# Patient Record
Sex: Male | Born: 1960 | Race: White | Hispanic: No | Marital: Married | Smoking: Former smoker
Health system: Southern US, Community
[De-identification: ages and names within clinical notes are randomized; demographics above are authoritative.]

## PROBLEM LIST (undated history)

## (undated) DIAGNOSIS — E119 Type 2 diabetes mellitus without complications: Secondary | ICD-10-CM

## (undated) HISTORY — PX: FRACTURE SURGERY: SHX138

---

## 2014-02-11 ENCOUNTER — Ambulatory Visit: Payer: Self-pay

## 2014-02-16 ENCOUNTER — Ambulatory Visit: Payer: Self-pay | Admitting: Emergency Medicine

## 2020-08-09 ENCOUNTER — Other Ambulatory Visit
Admission: RE | Admit: 2020-08-09 | Discharge: 2020-08-09 | Disposition: A | Discharge status: Home / Self Care | Payer: BC Managed Care – PPO | Source: Ambulatory Visit | Attending: Internal Medicine | Admitting: Internal Medicine

## 2020-08-09 ENCOUNTER — Other Ambulatory Visit: Payer: Self-pay

## 2020-08-09 DIAGNOSIS — Z20822 Contact with and (suspected) exposure to covid-19: Secondary | ICD-10-CM | POA: Insufficient documentation

## 2020-08-09 DIAGNOSIS — Z01818 Encounter for other preprocedural examination: Secondary | ICD-10-CM | POA: Insufficient documentation

## 2020-08-10 ENCOUNTER — Other Ambulatory Visit: Payer: Self-pay

## 2020-08-10 LAB — SARS CORONAVIRUS 2 (TAT 6-24 HRS): SARS Coronavirus 2: NEGATIVE

## 2021-02-06 ENCOUNTER — Other Ambulatory Visit (HOSPITAL_COMMUNITY): Payer: Self-pay | Admitting: Neurology

## 2021-02-06 ENCOUNTER — Other Ambulatory Visit: Payer: Self-pay | Admitting: Neurology

## 2021-02-06 DIAGNOSIS — G250 Essential tremor: Secondary | ICD-10-CM

## 2021-02-16 ENCOUNTER — Other Ambulatory Visit: Payer: Self-pay

## 2021-02-16 ENCOUNTER — Ambulatory Visit
Admission: RE | Admit: 2021-02-16 | Discharge: 2021-02-16 | Disposition: A | Discharge status: Home / Self Care | Payer: BC Managed Care – PPO | Source: Ambulatory Visit | Attending: Neurology | Admitting: Neurology

## 2021-02-16 DIAGNOSIS — G250 Essential tremor: Secondary | ICD-10-CM | POA: Diagnosis not present

## 2022-01-02 IMAGING — MR MR HEAD W/O CM
12 series · 48 of 48 positions shown · non-contrast
Comparison: No pertinent prior exams available for comparison.

CLINICAL DATA: Tremor, essential.

EXAM:
MRI HEAD WITHOUT CONTRAST
TECHNIQUE: Multiplanar, multiecho pulse sequences of the brain and surrounding
structures were obtained without intravenous contrast.

[Series 5: ax dwi_tracew · axial · 3.0mm · 0.65mm/px · z∈[-71,+83]mm · 4 of 48 slices shown]
[im 1/48]
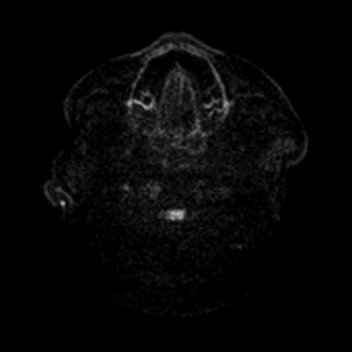
[im 16/48]
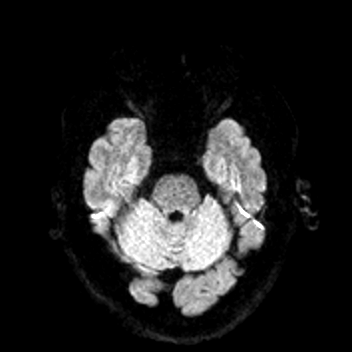
[im 32/48]
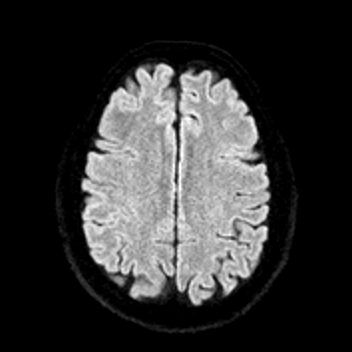
[im 48/48]
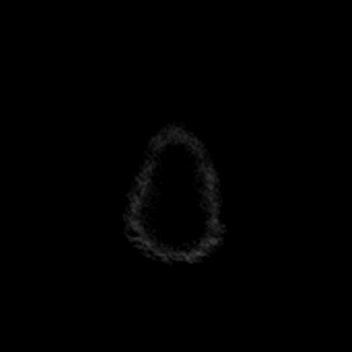

[Series 6: ax dwi_adc · axial · 3.0mm · 0.65mm/px · z∈[-71,+83]mm · 4 of 48 slices shown]
[im 1/48]
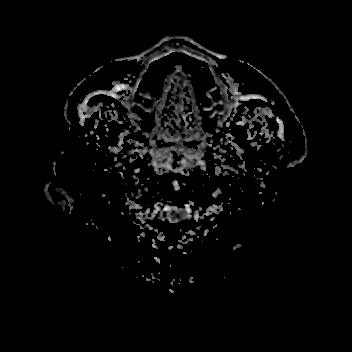
[im 16/48]
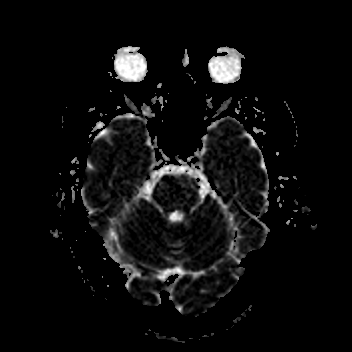
[im 32/48]
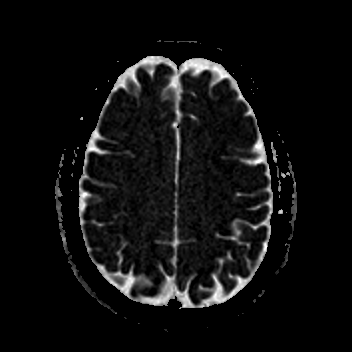
[im 48/48]
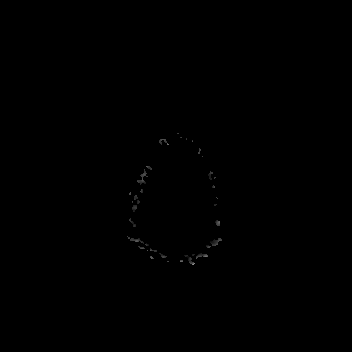

[Series 7: cor dwi_tracew · coronal · 5.0mm · 0.65mm/px · 3 of 38 slices shown]
[im 1/38]
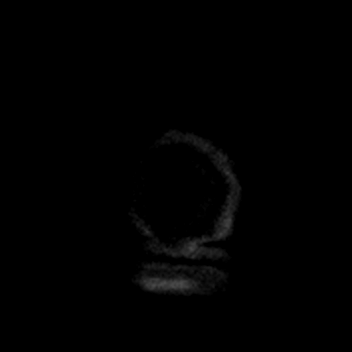
[im 19/38]
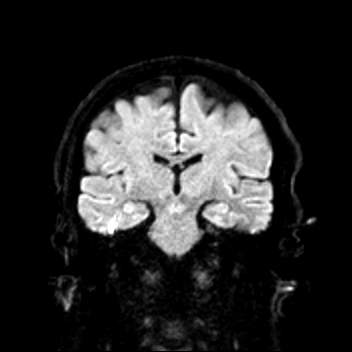
[im 38/38]
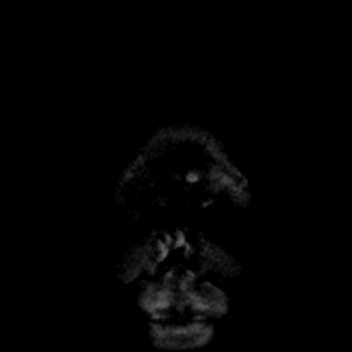

[Series 8: cor dwi_adc · coronal · 5.0mm · 0.65mm/px · 3 of 38 slices shown]
[im 1/38]
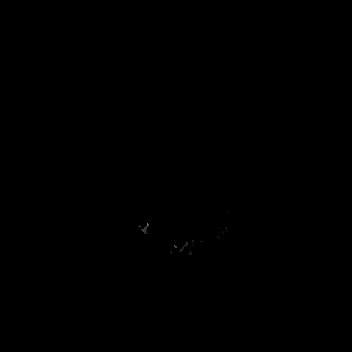
[im 19/38]
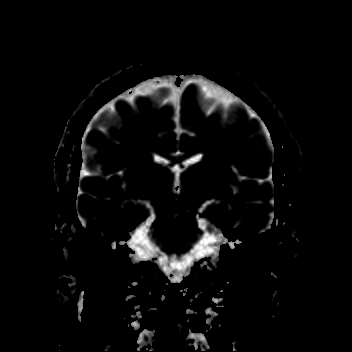
[im 38/38]
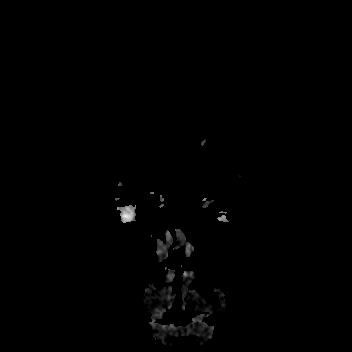

[Series 9: T1 · sagittal · 5.0mm · 0.62mm/px · 2 of 22 slices shown (1 of 2)]
[im 1/22]
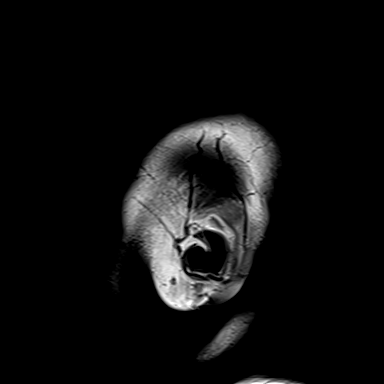
[im 22/22]
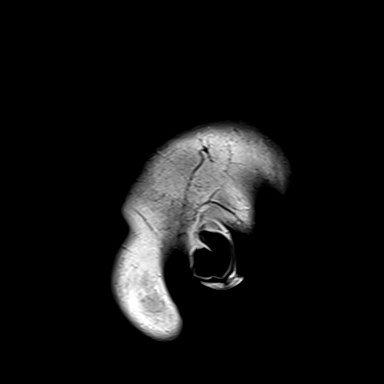

[Series 10: T2 · axial · 5.0mm · 0.53mm/px · z∈[-66,+77]mm · 2 of 25 slices shown (1 of 2)]
[im 1/25]
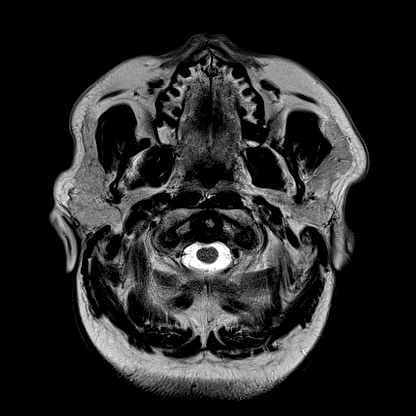
[im 25/25]
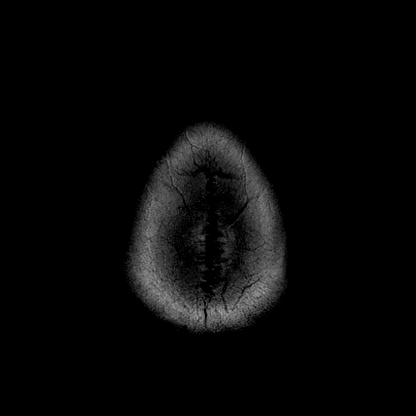

[Series 11: mag_images · axial · 3.0mm · 0.90mm/px · z∈[-81,+94]mm · 4 of 60 slices shown]
[im 1/60]
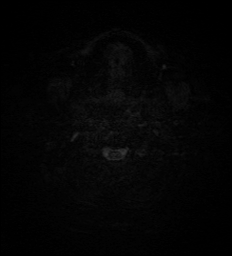
[im 20/60]
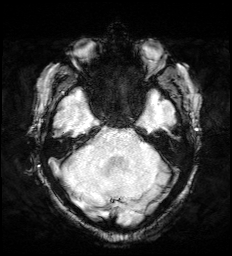
[im 40/60]
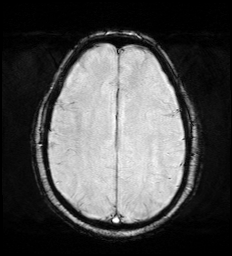
[im 60/60]
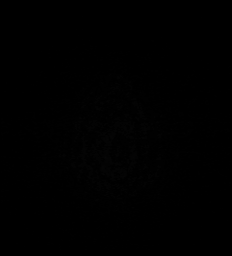

[Series 12: pha_images · axial · 3.0mm · 0.90mm/px · z∈[-81,+94]mm · 4 of 60 slices shown]
[im 1/60]
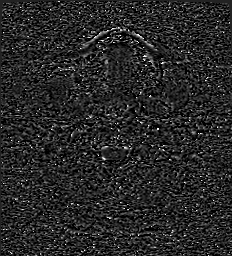
[im 20/60]
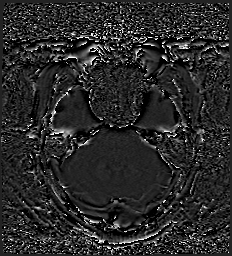
[im 40/60]
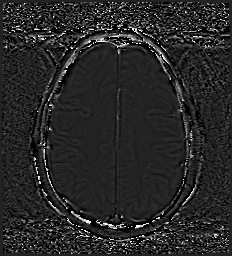
[im 60/60]
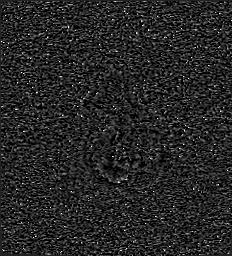

[Series 13: swi_images · axial · 3.0mm · 0.90mm/px · z∈[-81,+94]mm · 4 of 60 slices shown]
[im 1/60]
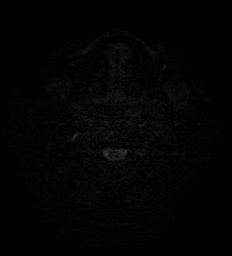
[im 20/60]
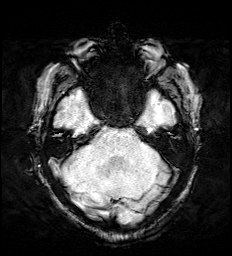
[im 40/60]
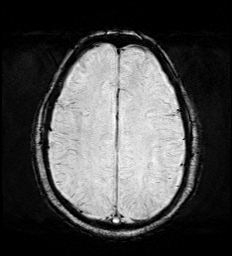
[im 60/60]
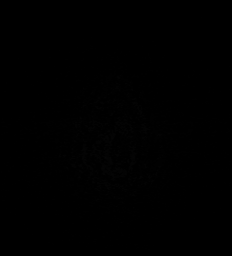

[Series 15: FLAIR · axial · 3.0mm · 0.53mm/px · z∈[-75,+86]mm · 4 of 55 slices shown]
[im 1/55]
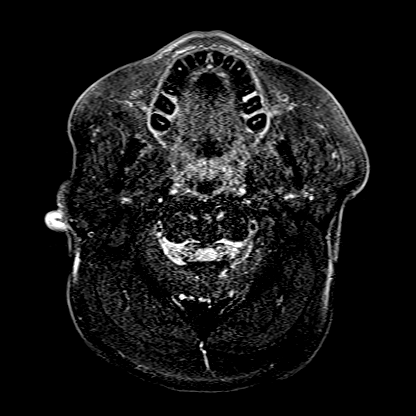
[im 19/55]
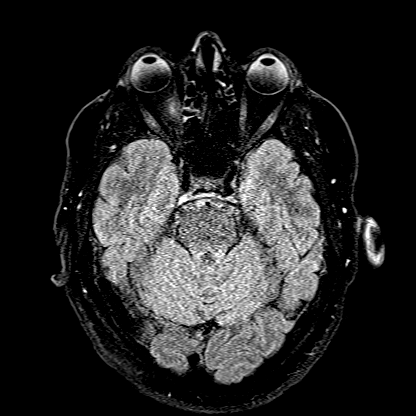
[im 37/55]
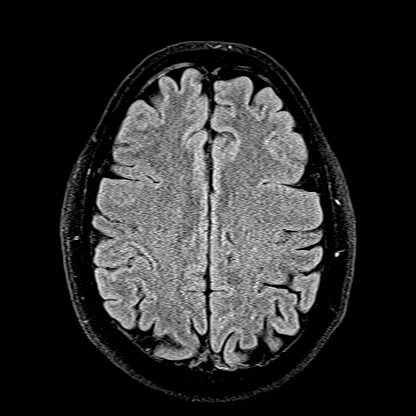
[im 55/55]
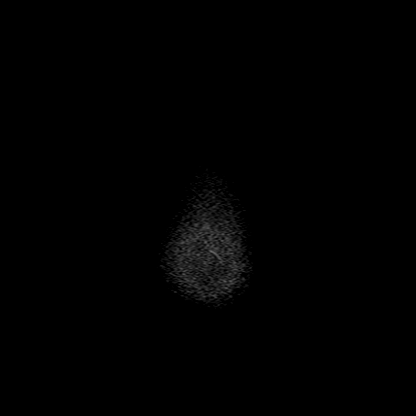

[Series 16: T1 · axial · 1.0mm · 0.98mm/px · z∈[-72,+86]mm · 12 of 160 slices shown (2 of 2)]
[im 1/160]
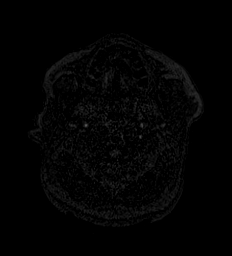
[im 15/160]
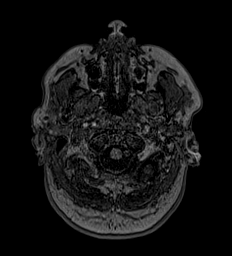
[im 29/160]
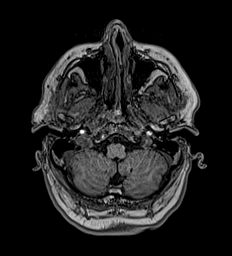
[im 44/160]
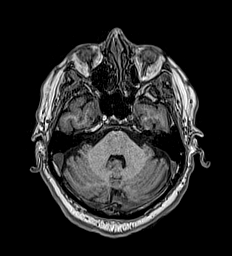
[im 58/160]
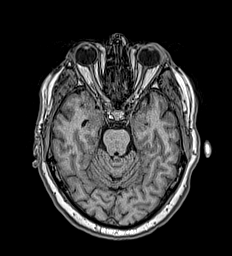
[im 73/160]
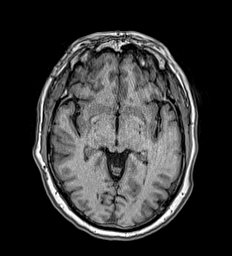
[im 87/160]
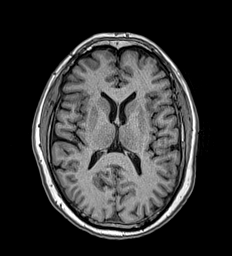
[im 102/160]
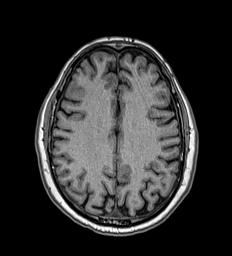
[im 116/160]
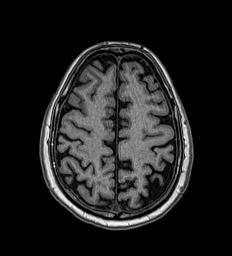
[im 131/160]
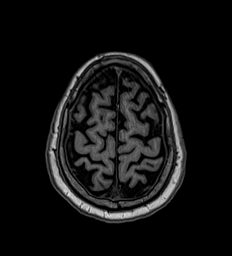
[im 145/160]
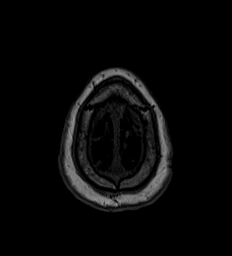
[im 160/160]
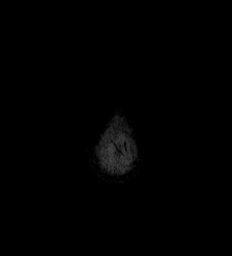

[Series 17: T2 · coronal · 5.0mm · 0.57mm/px · 2 of 29 slices shown (2 of 2)]
[im 1/29]
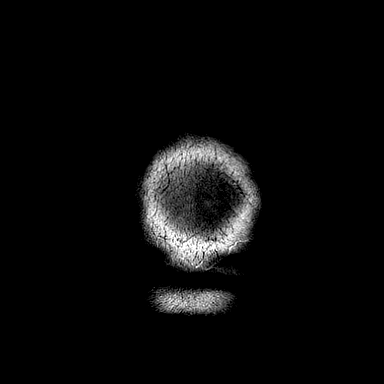
[im 29/29]
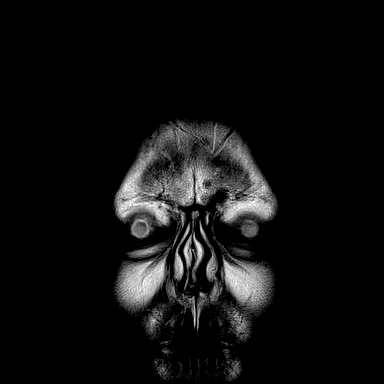

[48 of 48 positions shown; findings below may reference images not displayed]

FINDINGS: Brain:

Mild cerebral and cerebellar atrophy.

No cortical encephalomalacia is identified. No significant white
matter disease.

There is no acute infarct.

No evidence of intracranial mass.

No chronic intracranial blood products.

No extra-axial fluid collection.

No midline shift.

Vascular: Expected proximal arterial flow voids.

Skull and upper cervical spine: No focal marrow lesion.

Sinuses/Orbits: Visualized orbits show no acute finding. No
significant paranasal sinus disease
IMPRESSION: No evidence of acute intracranial abnormality.

Mild generalized parenchymal atrophy.

Otherwise unremarkable noncontrast MRI appearance of the brain.

## 2022-10-26 ENCOUNTER — Ambulatory Visit (INDEPENDENT_AMBULATORY_CARE_PROVIDER_SITE_OTHER): Payer: BC Managed Care – PPO

## 2022-10-26 ENCOUNTER — Ambulatory Visit
Admission: RE | Admit: 2022-10-26 | Discharge: 2022-10-26 | Disposition: A | Discharge status: Home / Self Care | Payer: BC Managed Care – PPO | Source: Ambulatory Visit | Attending: Family Medicine | Admitting: Family Medicine

## 2022-10-26 VITALS — BP 170/65 | HR 79 | Temp 97.8°F | Resp 16 | Ht 70.0 in | Wt 240.0 lb

## 2022-10-26 DIAGNOSIS — J40 Bronchitis, not specified as acute or chronic: Secondary | ICD-10-CM

## 2022-10-26 DIAGNOSIS — R059 Cough, unspecified: Secondary | ICD-10-CM | POA: Diagnosis not present

## 2022-10-26 DIAGNOSIS — I10 Essential (primary) hypertension: Secondary | ICD-10-CM

## 2022-10-26 DIAGNOSIS — Z87891 Personal history of nicotine dependence: Secondary | ICD-10-CM

## 2022-10-26 HISTORY — DX: Type 2 diabetes mellitus without complications: E11.9

## 2022-10-26 MED ORDER — CEFDINIR 300 MG PO CAPS: 300.0000 mg | ORAL_CAPSULE | Freq: Two times a day (BID) | ORAL | 0 refills | Status: AC

## 2022-10-26 MED ORDER — PREDNISONE 50 MG PO TABS: 50.0000 mg | ORAL_TABLET | Freq: Every day | ORAL | 0 refills | Status: AC

## 2022-10-26 MED ORDER — PROMETHAZINE-DM 6.25-15 MG/5ML PO SYRP: 5.0000 mL | ORAL_SOLUTION | Freq: Four times a day (QID) | ORAL | 0 refills | Status: DC | PRN

## 2022-10-26 NOTE — ED Triage Notes (Signed)
Patient c/o cough and chest congestion for 6 weeks.  Patient has been on no medication or antibiotics.  Patient denies fevers.

## 2022-10-26 NOTE — ED Provider Notes (Signed)
[facility]    CSN: [id number] Arrival date & time: 10/26/22  1641      History   Chief Complaint Chief Complaint  Patient presents with   Cough    Appointment    HPI Tommy Bowen is a 61 y.o. male.   HPI   Tommy Bowen presents for cough and congestion for the past 6 weeks. Taking NyQuil to help him sleep.  He has woken up several times due to his cough.  Notes that he used to smoke.  Denies any recent fevers, chills, vomiting, leg swelling, diarrhea, headache or abdominal pain.  He has high blood pressure and reports his BP was good the day he went to get it checked a few week ago.        Past Medical History:  Diagnosis Date   Diabetes mellitus without complication (HCC)     There are no problems to display for this patient.   Past Surgical History:  Procedure Laterality Date   FRACTURE SURGERY         Home Medications    Prior to Admission medications   Medication Sig Start Date End Date Taking? Authorizing Provider  cefdinir (OMNICEF) 300 MG capsule Take 1 capsule (300 mg total) by mouth 2 (two) times daily for 5 days. 10/26/22 10/31/22 Yes Hidaya Daniel, DO  empagliflozin (JARDIANCE) 25 MG TABS tablet Take 1 tablet by mouth daily. 08/20/22 08/20/23 Yes [provider]  glipiZIDE (GLUCOTROL) 5 MG tablet Take by mouth. 06/13/22  Yes [provider]  JANUVIA 100 MG tablet Take 1 tablet by mouth daily. 06/26/22  Yes [provider]  lisinopril (ZESTRIL) 20 MG tablet Take 20 mg by mouth daily.   Yes [provider]  metFORMIN (GLUCOPHAGE) 850 MG tablet Take 850 mg by mouth 3 (three) times daily.   Yes [provider]  pioglitazone (ACTOS) 45 MG tablet Take 1 tablet by mouth daily. 09/24/22  Yes [provider]  predniSONE (DELTASONE) 50 MG tablet Take 1 tablet (50 mg total) by mouth daily for 5 days. 10/26/22 10/31/22 Yes Vedh Ptacek, DO  promethazine-dextromethorphan (PROMETHAZINE-DM) 6.25-15  MG/5ML syrup Take 5 mLs by mouth 4 (four) times daily as needed. 10/26/22  Yes Anesia Blackwell, DO  propranolol ER (INDERAL LA) 80 MG 24 hr capsule Take by mouth. 05/31/22 05/31/23 Yes [provider]  atorvastatin (LIPITOR) 10 MG tablet Take 10 mg by mouth daily.    [provider]  Cholecalciferol (VITAMIN D-1000 MAX ST) 25 MCG (1000 UT) tablet Take by mouth.    [provider]    Family History History reviewed. No pertinent family history.  Social History Social History   Tobacco Use   Smoking status: Former    Types: Cigarettes   Smokeless tobacco: Former  Building services engineer Use: Never used  Substance Use Topics   Alcohol use: Yes   Drug use: Never     Allergies   Patient has no known allergies.   Review of Systems Review of Systems: negative unless otherwise stated in HPI.      Physical Exam Triage Vital Signs ED Triage Vitals  Enc Vitals Group     BP 10/26/22 1704 (!) 170/65     Pulse Rate 10/26/22 1704 79     Resp 10/26/22 1704 16     Temp 10/26/22 1704 97.8 F (36.6 C)     Temp Source 10/26/22 1704 Oral     SpO2 10/26/22 1704 97 %  Weight 10/26/22 1700 240 lb (108.9 kg)     Height 10/26/22 1700 5\' 10"  (1.778 m)     Head Circumference --      Peak Flow --      Pain Score 10/26/22 1700 0     Pain Loc --      Pain Edu? --      Excl. in GC? --    No data found.  Updated Vital Signs BP (!) 170/65 (BP Location: Right Arm)   Pulse 79   Temp 97.8 F (36.6 C) (Oral)   Resp 16   Ht 5\' 10"  (1.778 m)   Wt 108.9 kg   SpO2 97%   BMI 34.44 kg/m   Visual Acuity Right Eye Distance:   Left Eye Distance:   Bilateral Distance:    Right Eye Near:   Left Eye Near:    Bilateral Near:     Physical Exam GEN:     alert, non-toxic appearing male in no distress    HENT:  mucus membranes moist, no nasal discharge EYES:   pupils equal and reactive, no scleral injection or discharge NECK:  normal ROM RESP:  no increased work of  breathing, diffuse rhonchi and bronchospastic cough with deep breathing CVS:   regular rate and rhythm, no JVP, no appreciable murmur Skin:   warm and dry    UC Treatments / Results  Labs (all labs ordered are listed, but only abnormal results are displayed) Labs Reviewed - No data to display  EKG   Radiology No results found.  Procedures Procedures (including critical care time)  Medications Ordered in UC Medications - No data to display  Initial Impression / Assessment and Plan / UC Course  I have reviewed the triage vital signs and the nursing notes.  Pertinent labs & imaging results that were available during my care of the patient were reviewed by me and considered in my medical decision making (see chart for details).       Pt is a 61 y.o. male former smoker who presents for 6 weeks of cough that is not improving.  Tommy Bowen is  afebrile here without recent antipyretics. Satting well, on room air. Overall, pt is  non-toxic appearing, well hydrated, without respiratory distress. Pulmonary exam is remarkable for rhonchi that clear with cough.  I do not see any chest x-rays available and he is a former smoker.  He may have COPD.  After shared decision making, we will pursue chest x-ray.  COVID  and influenza testing deferred due to length of symptoms.   Treat acute bronchitis with steroids and antibiotics as below.  Promethazine DM cough syrup given for cough and allow patient to rest.  Typical duration of symptoms discussed.   Tommy Bowen is hypertensive today, BP 170/65.  On chart review at his last office visit on 10/01/2022 by his PCP his blood pressure was stable, BP 130/60.  He takes lisinopril 20 mg daily.  Advised to follow-up with his primary care provider about his blood pressures if they remain elevated.  He voiced understanding.  Return and ED precautions given. Discussed MDM, treatment plan and plan for follow-up with patient who agrees with plan.      Final  Clinical Impressions(s) / UC Diagnoses   Final diagnoses:  Bronchitis  Elevated blood pressure reading with diagnosis of hypertension  History of tobacco use     Discharge Instructions      Stop by the pharmacy to pick up your prescriptions.  Follow  up with your primary care provider, if cough is not improving.   Go to ED for red flag symptoms, including; fevers you cannot reduce with Tylenol/Motrin, severe headaches, vision changes, numbness/weakness in part of the body, lethargy, confusion, intractable vomiting, severe dehydration, chest pain, breathing difficulty, severe persistent abdominal or pelvic pain, signs of severe infection (increased redness, swelling of an area), feeling faint or passing out, dizziness, etc. You should especially go to the ED for sudden acute worsening of condition if you do not elect to go at this time.       ED Prescriptions     Medication Sig Dispense Auth. Provider   predniSONE (DELTASONE) 50 MG tablet Take 1 tablet (50 mg total) by mouth daily for 5 days. 5 tablet Lacara Dunsworth, DO   cefdinir (OMNICEF) 300 MG capsule Take 1 capsule (300 mg total) by mouth 2 (two) times daily for 5 days. 10 capsule Karrine Kluttz, DO   promethazine-dextromethorphan (PROMETHAZINE-DM) 6.25-15 MG/5ML syrup Take 5 mLs by mouth 4 (four) times daily as needed. 118 mL Michel Eskelson, Seward Meth, DO      I have reviewed the PDMP during this encounter.   Katha Cabal, DO 10/29/22 1153

## 2022-10-26 NOTE — Discharge Instructions (Addendum)
Stop by the pharmacy to pick up your prescriptions.  Follow up with your primary care provider, if cough is not improving.   Go to ED for red flag symptoms, including; fevers you cannot reduce with Tylenol/Motrin, severe headaches, vision changes, numbness/weakness in part of the body, lethargy, confusion, intractable vomiting, severe dehydration, chest pain, breathing difficulty, severe persistent abdominal or pelvic pain, signs of severe infection (increased redness, swelling of an area), feeling faint or passing out, dizziness, etc. You should especially go to the ED for sudden acute worsening of condition if you do not elect to go at this time.

## 2024-01-14 ENCOUNTER — Ambulatory Visit
Admission: RE | Admit: 2024-01-14 | Discharge: 2024-01-14 | Disposition: A | Discharge status: Home / Self Care | Payer: Self-pay | Source: Ambulatory Visit

## 2024-01-14 VITALS — BP 135/73 | HR 63 | Temp 98.3°F | Resp 16

## 2024-01-14 DIAGNOSIS — J069 Acute upper respiratory infection, unspecified: Secondary | ICD-10-CM | POA: Diagnosis not present

## 2024-01-14 MED ORDER — AMOXICILLIN-POT CLAVULANATE 875-125 MG PO TABS: 1.0000 | ORAL_TABLET | Freq: Two times a day (BID) | ORAL | 0 refills | Status: AC

## 2024-01-14 MED ORDER — IPRATROPIUM BROMIDE 0.06 % NA SOLN: 2.0000 | Freq: Four times a day (QID) | NASAL | 12 refills | Status: AC

## 2024-01-14 MED ORDER — PROMETHAZINE-DM 6.25-15 MG/5ML PO SYRP: 5.0000 mL | ORAL_SOLUTION | Freq: Four times a day (QID) | ORAL | 0 refills | Status: DC | PRN

## 2024-01-14 MED ORDER — BENZONATATE 100 MG PO CAPS: 200.0000 mg | ORAL_CAPSULE | Freq: Three times a day (TID) | ORAL | 0 refills | Status: AC

## 2024-01-14 NOTE — ED Provider Notes (Signed)
 [facility]    CSN: [id number] Arrival date & time: 01/14/24  1343      History   Chief Complaint Chief Complaint  Patient presents with   Cough    Wife (Piper Nong) was seen for same symptoms 01/08/2024. - Entered by patient    HPI Tommy Bowen is a 63 y.o. male.   HPI  63 year old male past medical history significant for diabetes presents for evaluation of respiratory symptoms that began 1 week ago that consist of runny nose, nasal congestion, postnasal drip, and a nonproductive cough.  He denies fever, sore throat, ear pain, shortness breath, or wheezing.  His wife has similar symptoms and was diagnosed with an upper respiratory tract infection.  Past Medical History:  Diagnosis Date   Diabetes mellitus without complication (HCC)     There are no active problems to display for this patient.   Past Surgical History:  Procedure Laterality Date   FRACTURE SURGERY         Home Medications    Prior to Admission medications   Medication Sig Start Date End Date Taking? Authorizing Provider  amoxicillin-clavulanate (AUGMENTIN) 875-125 MG tablet Take 1 tablet by mouth every 12 (twelve) hours for 7 days. 01/14/24 01/21/24 Yes Becky Augusta, NP  atorvastatin (LIPITOR) 10 MG tablet Take 10 mg by mouth daily.   Yes [provider]  benzonatate (TESSALON) 100 MG capsule Take 2 capsules (200 mg total) by mouth every 8 (eight) hours. 01/14/24  Yes Becky Augusta, NP  empagliflozin (JARDIANCE) 25 MG TABS tablet Take 1 tablet by mouth daily. 07/25/23  Yes [provider]  glipiZIDE (GLUCOTROL) 5 MG tablet Take by mouth. 06/13/22  Yes [provider]  ipratropium (ATROVENT) 0.06 % nasal spray Place 2 sprays into both nostrils 4 (four) times daily. 01/14/24  Yes Becky Augusta, NP  JANUVIA 100 MG tablet Take 1 tablet by mouth daily. 06/26/22  Yes [provider]  lisinopril (ZESTRIL) 20 MG tablet Take 20 mg by mouth daily.   Yes [provider]  metFORMIN (GLUCOPHAGE) 850 MG tablet Take 850 mg by mouth 3 (three) times daily.   Yes [provider]  promethazine-dextromethorphan (PROMETHAZINE-DM) 6.25-15 MG/5ML syrup Take 5 mLs by mouth 4 (four) times daily as needed. 01/14/24  Yes Becky Augusta, NP  Cholecalciferol (VITAMIN D-1000 MAX ST) 25 MCG (1000 UT) tablet Take by mouth.    [provider]  pioglitazone (ACTOS) 45 MG tablet Take 1 tablet by mouth daily. 09/24/22  Yes [provider]  propranolol ER (INDERAL LA) 80 MG 24 hr capsule Take by mouth. 05/31/22 01/14/24 Yes [provider]    Family History History reviewed. No pertinent family history.  Social History Social History   Tobacco Use   Smoking status: Former    Types: Cigarettes   Smokeless tobacco: Former  Building services engineer status: Never Used  Substance Use Topics   Alcohol use: Yes   Drug use: Never     Allergies   Patient has no known allergies.   Review of Systems Review of Systems  Constitutional:  Negative for fever.  HENT:  Positive for congestion and rhinorrhea. Negative for sore throat.   Respiratory:  Positive for cough. Negative for shortness of breath and wheezing.      Physical Exam Triage Vital Signs ED Triage Vitals [01/14/24 1408]  Encounter Vitals Group     BP      Systolic BP Percentile  Diastolic BP Percentile      Pulse      Resp      Temp      Temp src      SpO2      Weight      Height      Head Circumference      Peak Flow      Pain Score 0     Pain Loc      Pain Education      Exclude from Growth Chart    No data found.  Updated Vital Signs BP 135/73 (BP Location: Left Arm)   Pulse 63   Temp 98.3 F (36.8 C) (Oral)   Resp 16   SpO2 94%   Visual Acuity Right Eye Distance:   Left Eye Distance:   Bilateral Distance:    Right Eye Near:   Left Eye Near:    Bilateral Near:     Physical Exam Vitals and nursing note reviewed.  Constitutional:       Appearance: Normal appearance. He is not ill-appearing.  HENT:     Head: Normocephalic and atraumatic.     Right Ear: Tympanic membrane, ear canal and external ear normal. There is no impacted cerumen.     Left Ear: Tympanic membrane, ear canal and external ear normal. There is no impacted cerumen.     Nose: Congestion and rhinorrhea present.     Comments: Nasal mucosa is edematous erythematous with yellow discharge in both nares.    Mouth/Throat:     Mouth: Mucous membranes are moist.     Pharynx: Oropharynx is clear. No oropharyngeal exudate or posterior oropharyngeal erythema.  Cardiovascular:     Rate and Rhythm: Normal rate and regular rhythm.     Pulses: Normal pulses.     Heart sounds: Normal heart sounds. No murmur heard.    No friction rub. No gallop.  Pulmonary:     Effort: Pulmonary effort is normal.     Breath sounds: Normal breath sounds. No wheezing, rhonchi or rales.  Musculoskeletal:     Cervical back: Normal range of motion and neck supple. No tenderness.  Lymphadenopathy:     Cervical: No cervical adenopathy.  Skin:    General: Skin is warm and dry.     Capillary Refill: Capillary refill takes less than 2 seconds.     Findings: No rash.  Neurological:     General: No focal deficit present.     Mental Status: He is alert and oriented to person, place, and time.      UC Treatments / Results  Labs (all labs ordered are listed, but only abnormal results are displayed) Labs Reviewed - No data to display  EKG   Radiology No results found.  Procedures Procedures (including critical care time)  Medications Ordered in UC Medications - No data to display  Initial Impression / Assessment and Plan / UC Course  I have reviewed the triage vital signs and the nursing notes.  Pertinent labs & imaging results that were available during my care of the patient were reviewed by me and considered in my medical decision making (see chart for details).   Patient is a  pleasant, nontoxic-appearing 63 year old male presenting for evaluation of 1 week with the respiratory symptoms outlined HPI above.  His physical exam does reveal inflammation of his upper respiratory tract as evidenced by inflamed nasal mucosa with yellow discharge in both nares.  Oropharyngeal exam is benign.  No cervical adenopathy present  exam.  Cardiopulmonary exam physical lung sounds in all fields.  Patient exam is consistent with an upper respiratory infection.  Given that he has had symptoms for a week I do feel that a trial of antibiotics is warranted.  I will start him on Augmentin 875 mg twice daily for 7 days for treatment of URI.  Atrovent nasal spray for his nasal congestion.  Tessalon Perles and Promethazine DM cough syrup for cough and congestion.  Return precautions reviewed.   Final Clinical Impressions(s) / UC Diagnoses   Final diagnoses:  URI with cough and congestion     Discharge Instructions      Take the Augmentin twice daily with food for 7 days for treatment of your upper respiratory infection.  Use over-the-counter Tylenol and/or ibuprofen according the pack instructions as needed for any fever or pain.  Use the Atrovent nasal spray, 2 squirts in each nostril every 6 hours, as needed for runny nose and postnasal drip.  Use the Tessalon Perles every 8 hours during the day.  Take them with a small sip of water.  They may give you some numbness to the base of your tongue or a metallic taste in your mouth, this is normal.  Use the Promethazine DM cough syrup at bedtime for cough and congestion.  It will make you drowsy so do not take it during the day.  Return for reevaluation or see your primary care provider for any new or worsening symptoms.      ED Prescriptions     Medication Sig Dispense Auth. Provider   amoxicillin-clavulanate (AUGMENTIN) 875-125 MG tablet Take 1 tablet by mouth every 12 (twelve) hours for 7 days. 14 tablet Becky Augusta, NP   benzonatate  (TESSALON) 100 MG capsule Take 2 capsules (200 mg total) by mouth every 8 (eight) hours. 21 capsule Becky Augusta, NP   ipratropium (ATROVENT) 0.06 % nasal spray Place 2 sprays into both nostrils 4 (four) times daily. 15 mL Becky Augusta, NP   promethazine-dextromethorphan (PROMETHAZINE-DM) 6.25-15 MG/5ML syrup Take 5 mLs by mouth 4 (four) times daily as needed. 118 mL Becky Augusta, NP      PDMP not reviewed this encounter.   Becky Augusta, NP 01/14/24 1424

## 2024-01-14 NOTE — Discharge Instructions (Addendum)
 Take the Augmentin twice daily with food for 7 days for treatment of your upper respiratory infection.  Use over-the-counter Tylenol and/or ibuprofen according the pack instructions as needed for any fever or pain.  Use the Atrovent nasal spray, 2 squirts in each nostril every 6 hours, as needed for runny nose and postnasal drip.  Use the Tessalon Perles every 8 hours during the day.  Take them with a small sip of water.  They may give you some numbness to the base of your tongue or a metallic taste in your mouth, this is normal.  Use the Promethazine DM cough syrup at bedtime for cough and congestion.  It will make you drowsy so do not take it during the day.  Return for reevaluation or see your primary care provider for any new or worsening symptoms.

## 2024-01-14 NOTE — ED Triage Notes (Signed)
 Sx x 1 week   Non productive cough

## 2024-11-26 ENCOUNTER — Ambulatory Visit
Admission: RE | Admit: 2024-11-26 | Discharge: 2024-11-26 | Disposition: A | Discharge status: Home / Self Care | Payer: Self-pay | Source: Ambulatory Visit | Attending: Family Medicine | Admitting: Family Medicine

## 2024-11-26 VITALS — BP 162/74 | HR 67 | Temp 97.8°F | Resp 18

## 2024-11-26 DIAGNOSIS — J40 Bronchitis, not specified as acute or chronic: Secondary | ICD-10-CM | POA: Diagnosis not present

## 2024-11-26 DIAGNOSIS — Z87891 Personal history of nicotine dependence: Secondary | ICD-10-CM

## 2024-11-26 MED ORDER — PROMETHAZINE-DM 6.25-15 MG/5ML PO SYRP: 5.0000 mL | ORAL_SOLUTION | Freq: Four times a day (QID) | ORAL | 0 refills | Status: AC | PRN

## 2024-11-26 MED ORDER — AZITHROMYCIN 250 MG PO TABS: ORAL_TABLET | ORAL | 0 refills | Status: AC

## 2024-11-26 MED ORDER — PREDNISONE 20 MG PO TABS: 40.0000 mg | ORAL_TABLET | Freq: Every day | ORAL | 0 refills | Status: AC

## 2024-11-26 NOTE — ED Provider Notes (Signed)
 " [facility]    CSN: [id number] Arrival date & time: 11/26/24  0935      History   Chief Complaint Chief Complaint  Patient presents with   Cough    HPI Tommy Bowen is a 64 y.o. male.   HPI  History obtained from the patient. Tommy Bowen presents for productive cough for the past month.  Has been white-tan sputum. He saw his PCP who gave him some cough gel capsules and he completed all of them.  No fever, vomiting, diarrhea or fatigue.  Has occasional runny nose. Denies chest pain and shortness of breath.    No history of asthma.  He is a former smoker who quit so long ago.      Past Medical History:  Diagnosis Date   Diabetes mellitus without complication (HCC)     There are no active problems to display for this patient.   Past Surgical History:  Procedure Laterality Date   FRACTURE SURGERY         Home Medications    Prior to Admission medications  Medication Sig Start Date End Date Taking? Authorizing Provider  atorvastatin (LIPITOR) 10 MG tablet Take 10 mg by mouth daily.   Yes [provider]  azithromycin  (ZITHROMAX  Z-PAK) 250 MG tablet Take 2 tablets on day 1 then 1 tablet daily 11/26/24  Yes Kathlynn Swofford, DO  benzonatate  (TESSALON ) 100 MG capsule Take 2 capsules (200 mg total) by mouth every 8 (eight) hours. 01/14/24  Yes Bernardino Ditch, NP  Cholecalciferol (VITAMIN D-1000 MAX ST) 25 MCG (1000 UT) tablet Take by mouth.   Yes [provider]  empagliflozin (JARDIANCE) 25 MG TABS tablet Take 1 tablet by mouth daily. 07/25/23  Yes [provider]  glipiZIDE (GLUCOTROL) 5 MG tablet Take by mouth. 06/13/22  Yes [provider]  JANUVIA 100 MG tablet Take 1 tablet by mouth daily. 06/26/22  Yes [provider]  lisinopril (ZESTRIL) 20 MG tablet Take 20 mg by mouth daily.   Yes [provider]  metFORMIN (GLUCOPHAGE) 850 MG tablet Take 850 mg by mouth 3 (three) times daily.   Yes [provider]  pioglitazone (ACTOS) 45 MG tablet Take 1 tablet by mouth daily. 09/24/22  Yes [provider]  predniSONE  (DELTASONE ) 20 MG tablet Take 2 tablets (40 mg total) by mouth daily for 5 days. 11/26/24 12/01/24 Yes Zamari Vea, DO  propranolol ER (INDERAL LA) 80 MG 24 hr capsule Take by mouth. 05/31/22 11/26/24 Yes [provider]  ipratropium (ATROVENT ) 0.06 % nasal spray Place 2 sprays into both nostrils 4 (four) times daily. 01/14/24   Bernardino Ditch, NP  promethazine -dextromethorphan (PROMETHAZINE -DM) 6.25-15 MG/5ML syrup Take 5 mLs by mouth 4 (four) times daily as needed. 11/26/24   Jizelle Conkey, DO    Family History History reviewed. No pertinent family history.  Social History Social History[1]   Allergies   Patient has no known allergies.   Review of Systems Review of Systems: negative unless otherwise stated in HPI.      Physical Exam Triage Vital Signs ED Triage Vitals  Encounter Vitals Group     BP      Girls Systolic BP Percentile      Girls Diastolic BP Percentile      Boys Systolic BP Percentile      Boys Diastolic BP Percentile      Pulse      Resp      Temp  Temp src      SpO2      Weight      Height      Head Circumference      Peak Flow      Pain Score      Pain Loc      Pain Education      Exclude from Growth Chart    No data found.  Updated Vital Signs BP (!) 162/74 (BP Location: Left Arm)   Pulse 67   Temp 97.8 F (36.6 C) (Oral)   Resp 18   SpO2 97%   Visual Acuity Right Eye Distance:   Left Eye Distance:   Bilateral Distance:    Right Eye Near:   Left Eye Near:    Bilateral Near:     Physical Exam GEN:     alert, non-toxic appearing male in no distress    HENT:  mucus membranes moist, no nasal discharge RESP:  no increased work of breathing, rhonchi bilaterally CVS:   regular rate and rhythm Skin:   warm and dry    UC Treatments / Results  Labs (all labs ordered are listed, but only  abnormal results are displayed) Labs Reviewed - No data to display  EKG   Radiology No results found.   Procedures Procedures (including critical care time)  Medications Ordered in UC Medications - No data to display  Initial Impression / Assessment and Plan / UC Course  I have reviewed the triage vital signs and the nursing notes.  Pertinent labs & imaging results that were available during my care of the patient were reviewed by me and considered in my medical decision making (see chart for details).       Pt is a 64 y.o. male who is a former smoker presents for 4-5 weeks of cough that is not improving.  Tommy Bowen is  afebrile here without recent antipyretics. Satting well on room air. Overall pt is  non-toxic appearing, well hydrated, without respiratory distress. Pulmonary exam is remarkable for rhonchi that clear with cough.  After shared decision making, we will not pursue chest x-ray at this time.  COVID  and influenza testing deferred due to length of symptoms.   Treat acute bronchitis with steroids and antibiotics as below.  Promethazine  DM cough syrup given for cough and allow patient to rest.  Typical duration of symptoms discussed. Return and ED precautions given and patient voiced understanding.   Discussed MDM, treatment plan and plan for follow-up with patient who agrees with plan.      Final Clinical Impressions(s) / UC Diagnoses   Final diagnoses:  Bronchitis  Former smoker     Discharge Instructions      Stop at the pharmacy to pick up your antibiotics and steroids.  Be sure to monitor your blood sugar while taking prednisone .  The cough medicine may make you a little sleepy.  Do not drive or operate heavy machinery with taking this syrup, if you become drowsy.  We discussed getting a chest x-ray but this was deferred for now.  If your cough does not improve, return to the urgent care or see your primary care doctor as you may need a chest x-ray.         ED Prescriptions     Medication Sig Dispense Auth. Provider   promethazine -dextromethorphan (PROMETHAZINE -DM) 6.25-15 MG/5ML syrup Take 5 mLs by mouth 4 (four) times daily as needed. 118 mL Shelagh Rayman, DO   predniSONE  (DELTASONE ) 20 MG tablet  Take 2 tablets (40 mg total) by mouth daily for 5 days. 10 tablet Charo Philipp, DO   azithromycin  (ZITHROMAX  Z-PAK) 250 MG tablet Take 2 tablets on day 1 then 1 tablet daily 6 tablet Kayzen Kendzierski, DO      PDMP not reviewed this encounter.     [1]  Social History Tobacco Use   Smoking status: Former    Types: Cigarettes   Smokeless tobacco: Former  Building Services Engineer status: Never Used  Substance Use Topics   Alcohol use: Yes   Drug use: Never     Kriste Berth, DO 11/26/24 0957  "

## 2024-11-26 NOTE — Discharge Instructions (Addendum)
 Stop at the pharmacy to pick up your antibiotics and steroids.  Be sure to monitor your blood sugar while taking prednisone .  The cough medicine may make you a little sleepy.  Do not drive or operate heavy machinery with taking this syrup, if you become drowsy.  We discussed getting a chest x-ray but this was deferred for now.  If your cough does not improve, return to the urgent care or see your primary care doctor as you may need a chest x-ray.

## 2024-11-26 NOTE — ED Triage Notes (Signed)
 Dry cough ongoing a month now. States the cough has not gotten worse just persistent. Also having some chest congestion and runny nose.   Patient tried Tessalon  perles with no relief and has now run out.
